# Patient Record
Sex: Female | Born: 1990 | Race: Black or African American | Hispanic: No | Marital: Single
Health system: Southern US, Community
[De-identification: ages and names within clinical notes are randomized; demographics above are authoritative.]

---
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
## 2016-07-03 ENCOUNTER — Emergency Department (HOSPITAL_COMMUNITY): Admit: 2016-07-03 | Discharge: 2016-07-03 | Discharge status: Absent without leave | Payer: Self-pay

## 2016-07-03 NOTE — ED Notes (Signed)
Unable to find patient.
